# Patient Record
Sex: Male | Born: 1975 | Race: White | Hispanic: No | Smoking: Never smoker
Health system: Southern US, Community
[De-identification: ages and names within clinical notes are randomized; demographics above are authoritative.]

---

## 2015-10-10 ENCOUNTER — Emergency Department (HOSPITAL_COMMUNITY)
Admission: EM | Admit: 2015-10-10 | Discharge: 2015-10-10 | Disposition: A | Discharge status: Home / Self Care | Attending: Emergency Medicine | Admitting: Emergency Medicine

## 2015-10-10 ENCOUNTER — Emergency Department (HOSPITAL_COMMUNITY)

## 2015-10-10 ENCOUNTER — Encounter (HOSPITAL_COMMUNITY): Payer: Self-pay | Admitting: *Deleted

## 2015-10-10 DIAGNOSIS — Z87442 Personal history of urinary calculi: Secondary | ICD-10-CM | POA: Diagnosis not present

## 2015-10-10 DIAGNOSIS — M545 Low back pain, unspecified: Secondary | ICD-10-CM

## 2015-10-10 DIAGNOSIS — R1013 Epigastric pain: Secondary | ICD-10-CM | POA: Diagnosis not present

## 2015-10-10 DIAGNOSIS — R109 Unspecified abdominal pain: Secondary | ICD-10-CM | POA: Diagnosis present

## 2015-10-10 LAB — CBC WITH DIFFERENTIAL/PLATELET
BASOS PCT: 1 %
Basophils Absolute: 0.1 10*3/uL (ref 0.0–0.1)
EOS ABS: 0.4 10*3/uL (ref 0.0–0.7)
EOS PCT: 4 %
HCT: 47.3 % (ref 39.0–52.0)
Hemoglobin: 16.2 g/dL (ref 13.0–17.0)
LYMPHS ABS: 3.7 10*3/uL (ref 0.7–4.0)
Lymphocytes Relative: 33 %
MCH: 30.7 pg (ref 26.0–34.0)
MCHC: 34.2 g/dL (ref 30.0–36.0)
MCV: 89.8 fL (ref 78.0–100.0)
Monocytes Absolute: 1.6 10*3/uL — ABNORMAL HIGH (ref 0.1–1.0)
Monocytes Relative: 14 %
NEUTROS PCT: 49 %
Neutro Abs: 5.5 10*3/uL (ref 1.7–7.7)
PLATELETS: 261 10*3/uL (ref 150–400)
RBC: 5.27 MIL/uL (ref 4.22–5.81)
RDW: 13.3 % (ref 11.5–15.5)
WBC: 11.2 10*3/uL — AB (ref 4.0–10.5)

## 2015-10-10 LAB — URINE MICROSCOPIC-ADD ON: RBC / HPF: NONE SEEN RBC/hpf (ref 0–5)

## 2015-10-10 LAB — COMPREHENSIVE METABOLIC PANEL
ALT: 43 U/L (ref 17–63)
AST: 35 U/L (ref 15–41)
Albumin: 4.5 g/dL (ref 3.5–5.0)
Alkaline Phosphatase: 56 U/L (ref 38–126)
Anion gap: 9 (ref 5–15)
BUN: 12 mg/dL (ref 6–20)
CHLORIDE: 105 mmol/L (ref 101–111)
CO2: 31 mmol/L (ref 22–32)
Calcium: 9.7 mg/dL (ref 8.9–10.3)
Creatinine, Ser: 0.96 mg/dL (ref 0.61–1.24)
Glucose, Bld: 97 mg/dL (ref 65–99)
POTASSIUM: 4.7 mmol/L (ref 3.5–5.1)
SODIUM: 145 mmol/L (ref 135–145)
Total Bilirubin: 0.6 mg/dL (ref 0.3–1.2)
Total Protein: 7.5 g/dL (ref 6.5–8.1)

## 2015-10-10 LAB — URINALYSIS, ROUTINE W REFLEX MICROSCOPIC
Glucose, UA: NEGATIVE mg/dL
HGB URINE DIPSTICK: NEGATIVE
KETONES UR: NEGATIVE mg/dL
Leukocytes, UA: NEGATIVE
NITRITE: NEGATIVE
PROTEIN: 30 mg/dL — AB
pH: 5.5 (ref 5.0–8.0)

## 2015-10-10 LAB — LIPASE, BLOOD: LIPASE: 30 U/L (ref 11–51)

## 2015-10-10 MED ORDER — FAMOTIDINE 20 MG PO TABS: 20.0000 mg | ORAL_TABLET | Freq: Two times a day (BID) | ORAL | Status: AC

## 2015-10-10 MED ORDER — FAMOTIDINE 20 MG PO TABS
20.0000 mg | ORAL_TABLET | Freq: Once | ORAL | Status: AC
  Administered 2015-10-10: 20 mg via ORAL
  Filled 2015-10-10: qty 1

## 2015-10-10 MED ORDER — IOHEXOL 300 MG/ML  SOLN
100.0000 mL | Freq: Once | INTRAMUSCULAR | Status: AC | PRN
  Administered 2015-10-10: 100 mL via INTRAVENOUS

## 2015-10-10 MED ORDER — ONDANSETRON HCL 4 MG/2ML IJ SOLN
4.0000 mg | Freq: Once | INTRAMUSCULAR | Status: AC
  Administered 2015-10-10: 4 mg via INTRAVENOUS
  Filled 2015-10-10: qty 2

## 2015-10-10 MED ORDER — MORPHINE SULFATE (PF) 4 MG/ML IV SOLN
4.0000 mg | Freq: Once | INTRAVENOUS | Status: AC
  Administered 2015-10-10: 4 mg via INTRAVENOUS
  Filled 2015-10-10: qty 1

## 2015-10-10 NOTE — ED Notes (Signed)
Pt reporting he saw a physician today and was informed he had kidney stones.  Reports painful urination and pain across lower back.  Pt unsure what side kidney stone is on.

## 2015-10-10 NOTE — Discharge Instructions (Signed)

## 2015-10-10 NOTE — ED Provider Notes (Signed)
CSN: [id number]     Arrival date & time 10/10/15  1857 History  By signing my name below, I, Keith Mckinney, attest that this documentation has been prepared under the direction and in the presence of Keith Hong, MD. Electronically Signed: Soijett Mckinney, ED Scribe. 10/10/2015. 9:14 PM.   Chief Complaint  Patient presents with  . Flank Pain      The history is provided by the patient. No language interpreter was used.    HPI Comments: Keith Mckinney is a 39 y.o. male who presents to the Emergency Department via [facility] complaining of constant, worsening, flank pain onset 3-4 days. He notes that he was seen on 10/10/2015 while at prison and was inforemed that he had kidney stons but he is unsure of what side the stone is on.and was informed by the physician today while at prison that he had kidney stones but he is unsure of what side the kidney stone is on. He notes that he will be released from prison this weekend and he has been incarcerated x 8 months. He states that he is having associated symptoms of dysuria, low back pain, abdominal pain, and vomiting. He states that he has not tried any medications for the relief for his symptoms. He denies penile drainage/pain, urinary infections, and any other symptoms. Denies abdomdinal surgery. Denies any PMHx of medical issues.    History reviewed. No pertinent past medical history. History reviewed. No pertinent past surgical history. History reviewed. No pertinent family history. Social History  Substance Use Topics  . Smoking status: Never Smoker   . Smokeless tobacco: None  . Alcohol Use: No    Review of Systems  All other systems reviewed and are negative.     Allergies  Ibuprofen  Home Medications   Prior to Admission medications   Medication Sig Start Date End Date Taking? Authorizing Provider  famotidine (PEPCID) 20 MG tablet Take 1 tablet (20 mg total) by mouth 2 (two) times daily. 10/10/15   Keith Hong, MD   BP 132/90 mmHg   Pulse 58  Temp(Src) 97.3 F (36.3 C) (Oral)  Resp 18  Ht  (1.702 m)  Wt 175 lb (79.379 kg)  BMI 27.40 kg/m2  SpO2 99% Physical Exam  Constitutional: He is oriented to person, place, and time. He appears well-developed and well-nourished. No distress.  HENT:  Head: Normocephalic and atraumatic.  Right Ear: Hearing normal.  Left Ear: Hearing normal.  Nose: Nose normal.  Mouth/Throat: Oropharynx is clear and moist and mucous membranes are normal.  Eyes: Conjunctivae and EOM are normal. Pupils are equal, round, and reactive to light.  Neck: Normal range of motion. Neck supple.  Cardiovascular: Regular rhythm, S1 normal and S2 normal.  Exam reveals no gallop and no friction rub.   No murmur heard. Pulmonary/Chest: Effort normal and breath sounds normal. No respiratory distress. He exhibits no tenderness.  Abdominal: Soft. Normal appearance and bowel sounds are normal. There is no hepatosplenomegaly. There is tenderness in the epigastric area. There is no rebound, no guarding, no CVA tenderness, no tenderness at McBurney's point and negative Murphy's sign. No hernia.  Epigastric tenderness. Right inguinal tenderness and lower quadrant tenderness. No CVA tenderness.   Genitourinary: Testes normal and penis normal.  Nl penis, testes, and scrotum.  Musculoskeletal: Normal range of motion.  Neurological: He is alert and oriented to person, place, and time. He has normal strength. No cranial nerve deficit or sensory deficit. Coordination normal. GCS eye subscore is 4. GCS  verbal subscore is 5. GCS motor subscore is 6.  Skin: Skin is warm, dry and intact. No rash noted. No cyanosis.  Psychiatric: He has a normal mood and affect. His speech is normal and behavior is normal. Thought content normal.  Nursing note and vitals reviewed.   ED Course  Procedures (including critical care time) DIAGNOSTIC STUDIES: Oxygen Saturation is 97% on RA, nl by my interpretation.    COORDINATION OF  CARE: 9:13 PM Discussed treatment plan with pt at bedside which includes UA, labs, morphine injection, zofran, and CT abdomen pelvis with contrast and pt agreed to plan.    Labs Review Labs Reviewed  URINALYSIS, ROUTINE W REFLEX MICROSCOPIC (NOT AT [facility]) - Abnormal; Notable for the following:    Specific Gravity, Urine >1.030 (*)    Bilirubin Urine SMALL (*)    Protein, ur 30 (*)    All other components within normal limits  CBC WITH DIFFERENTIAL/PLATELET - Abnormal; Notable for the following:    WBC 11.2 (*)    Monocytes Absolute 1.6 (*)    All other components within normal limits  URINE MICROSCOPIC-ADD ON - Abnormal; Notable for the following:    Squamous Epithelial / LPF 0-5 (*)    Bacteria, UA RARE (*)    All other components within normal limits  COMPREHENSIVE METABOLIC PANEL  LIPASE, BLOOD    Imaging Review Ct Abdomen Pelvis W Contrast  10/10/2015  CLINICAL DATA:  Constant worsening low back pain and flank pain for 4 days. Associated dysuria, abdominal pain, and vomiting. EXAM: CT ABDOMEN AND PELVIS WITH CONTRAST TECHNIQUE: Multidetector CT imaging of the abdomen and pelvis was performed using the standard protocol following bolus administration of intravenous contrast. CONTRAST:  100mL OMNIPAQUE IOHEXOL 300 MG/ML  SOLN COMPARISON:  None. FINDINGS: Dependent atelectasis in the lung bases. Diffuse fatty infiltration of the liver. No focal liver lesions. The gallbladder, pancreas, adrenal glands, kidneys, abdominal aorta, inferior vena cava, and retroperitoneal lymph nodes are unremarkable. Stomach, small bowel, and colon are not abnormally distended. No discrete wall thickening is appreciated. Scattered stool in the colon. No free air or free fluid in the abdomen. Abdominal wall musculature appears intact. Pelvis: Scattered diverticula in the sigmoid colon without evidence of diverticulitis. Bladder wall is not thickened. No bladder stones. Prostate gland is not enlarged. No free or  loculated pelvic fluid collections. No pelvic mass or lymphadenopathy. The appendix is normal. No destructive bone lesions. IMPRESSION: No acute process demonstrated in the abdomen or pelvis. No evidence of bowel obstruction or inflammation. Diffuse fatty infiltration of the liver. Diverticula in the colon without evidence of diverticulitis. Electronically Signed   By: Burman NievesWilliam  Stevens M.D.   On: 10/10/2015 22:52   I have personally reviewed and evaluated these images and lab results as part of my medical decision-making.    MDM   Final diagnoses:  Epigastric pain  Bilateral low back pain without sciatica    The patient is well-appearing, he has a negative CT scan of the abdomen and pelvis showing no signs of acute pathology. His urinalysis does not show infection nor does it show hematuriaThe patient has normal heart sounds, no murmurs rubs or gallops, no tachycardia, strong pulses at the radial arteries bilaterally, no JVD, normal capillary refill time less than 2 seconds. The patient has no signs of aneurysm, no fever, no tachycardia and is otherwise stable for discharge. The etiology of the patient's symptoms is not clear, he can be started on anti-acid medications in hopes that he  has some relief of his epigastric pain, there is no signs of perforation, no signs of kidney stone, no other acute pathology that would require admission to the hospital for further testing. The patient can further pursue his symptoms as an outpatient.  Meds given in ED:  Medications  famotidine (PEPCID) tablet 20 mg (not administered)  morphine 4 MG/ML injection 4 mg (4 mg Intravenous Given 10/10/15 2132)  ondansetron (ZOFRAN) injection 4 mg (4 mg Intravenous Given 10/10/15 2132)  iohexol (OMNIPAQUE) 300 MG/ML solution 100 mL (100 mLs Intravenous Contrast Given 10/10/15 2233)    New Prescriptions   FAMOTIDINE (PEPCID) 20 MG TABLET    Take 1 tablet (20 mg total) by mouth 2 (two) times daily.      I  personally performed the services described in this documentation, which was scribed in my presence. The recorded information has been reviewed and is accurate.       Keith Hong, MD 10/10/15 [phone]

## 2017-08-07 IMAGING — CT CT ABD-PELV W/ CM
2 of 4 series · 16 of 46 positions shown, 18 images · IV contrast (omnipaque)
Comparison: None.

CLINICAL DATA: Constant worsening low back pain and flank pain for
4 days. Associated dysuria, abdominal pain, and vomiting.

EXAM:
CT ABDOMEN AND PELVIS WITH CONTRAST
TECHNIQUE: Multidetector CT imaging of the abdomen and pelvis was performed
using the standard protocol following bolus administration of
intravenous contrast.
CONTRAST:  100mL OMNIPAQUE IOHEXOL 300 MG/ML  SOLN

[Series 2: abd_pel_with 5.0 b40f · axial · 0.75mm/px · z∈[-560,-70]mm · 13 of 108 slices shown, 15 images]
[im 5/108  soft-tissue]
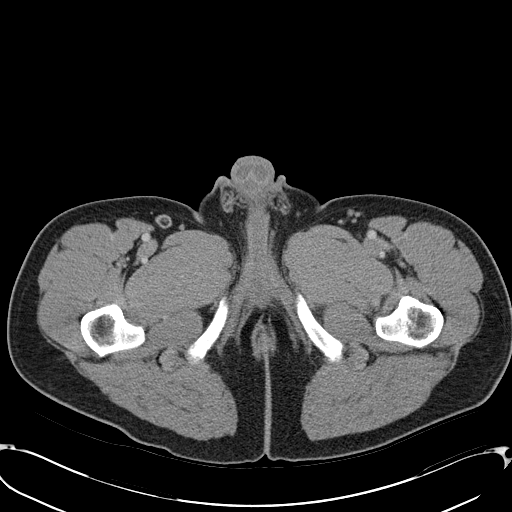
[im 5/108  bone]
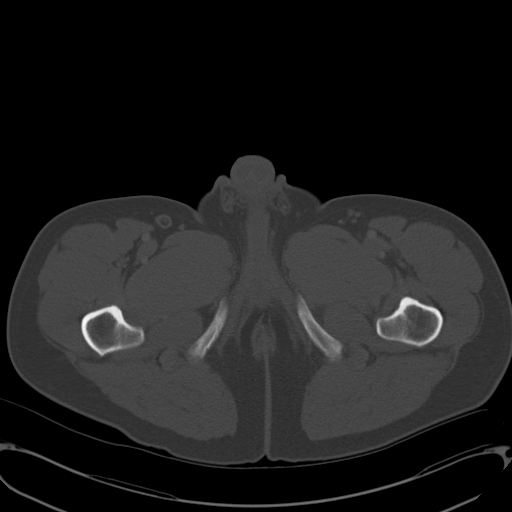
[im 14/108  soft-tissue]
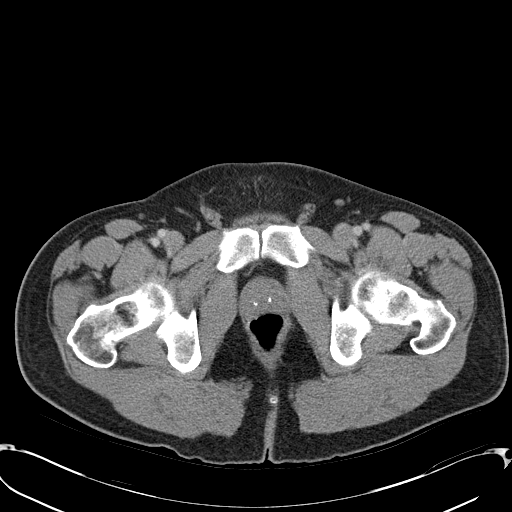
[im 24/108  soft-tissue]
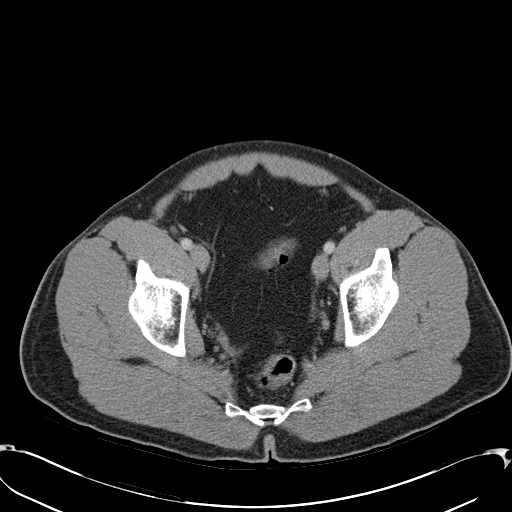
[im 28/108  soft-tissue]
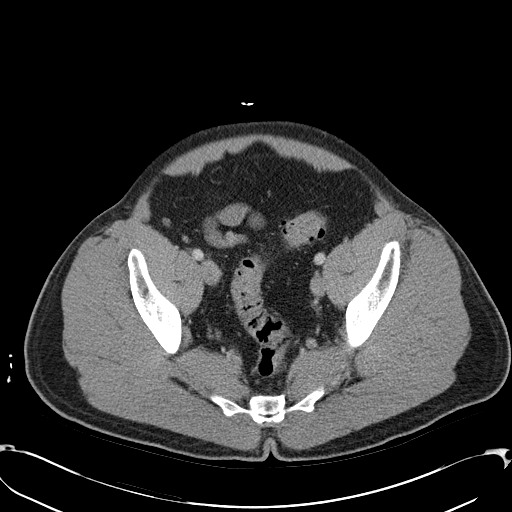
[im 38/108  soft-tissue]
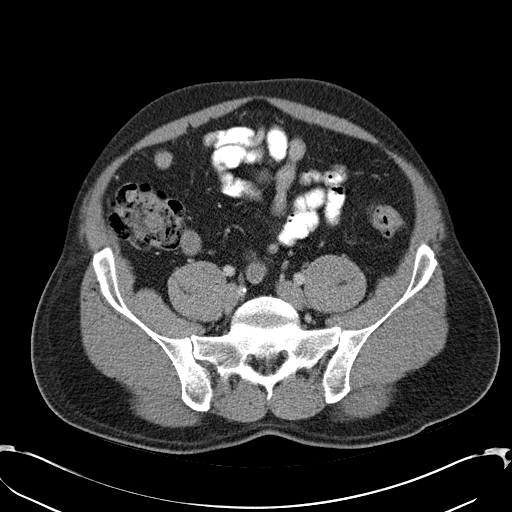
[im 47/108  soft-tissue]
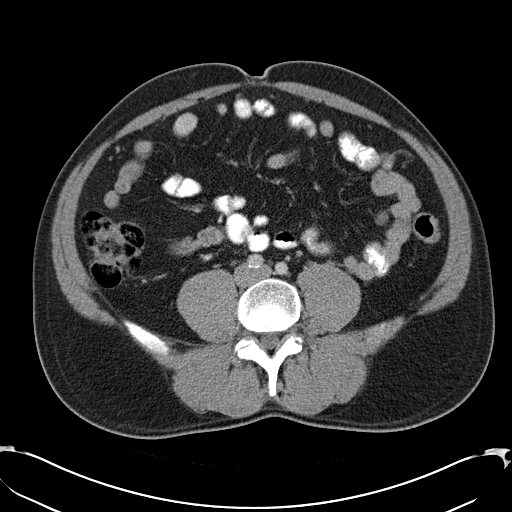
[im 56/108  soft-tissue]
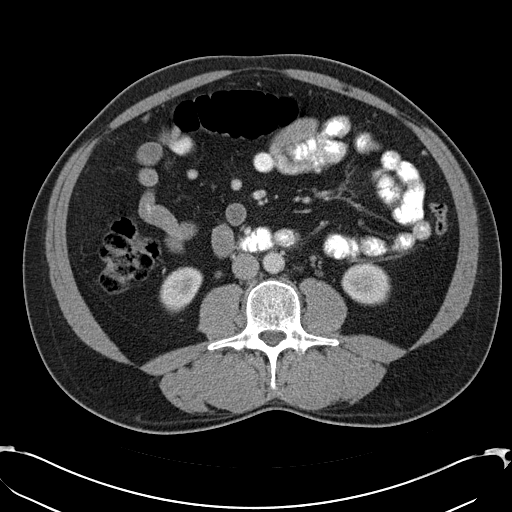
[im 61/108  soft-tissue]
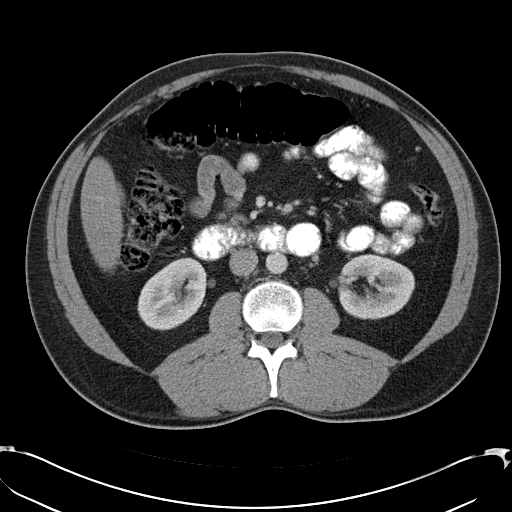
[im 70/108  soft-tissue]
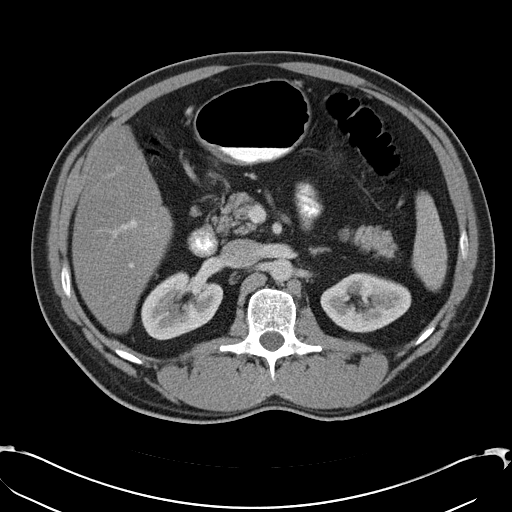
[im 70/108  bone]
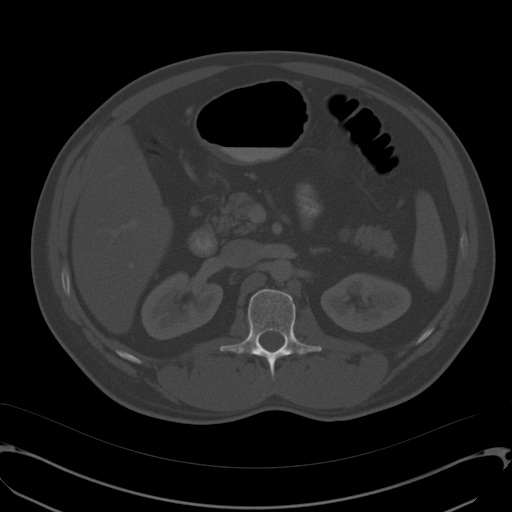
[im 80/108  soft-tissue]
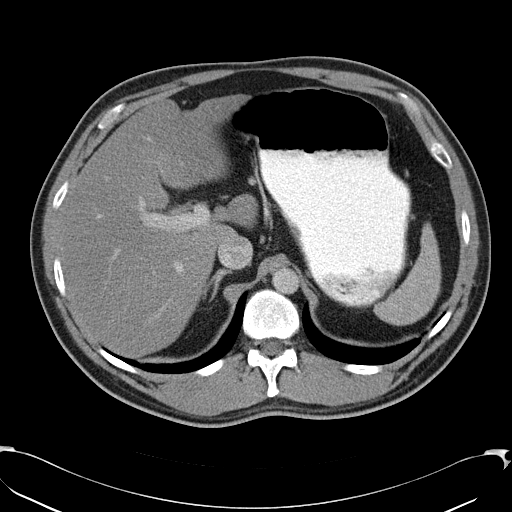
[im 84/108  soft-tissue]
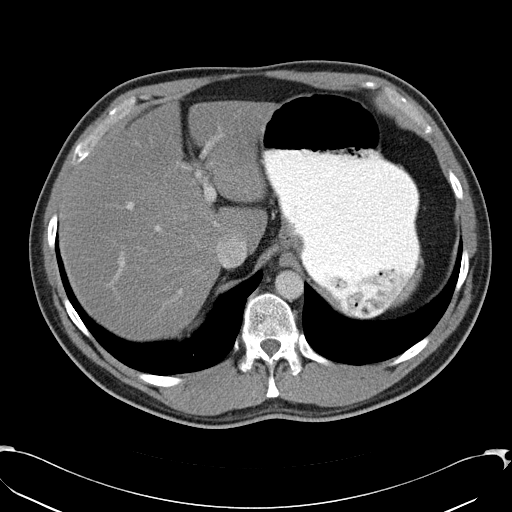
[im 94/108  soft-tissue]
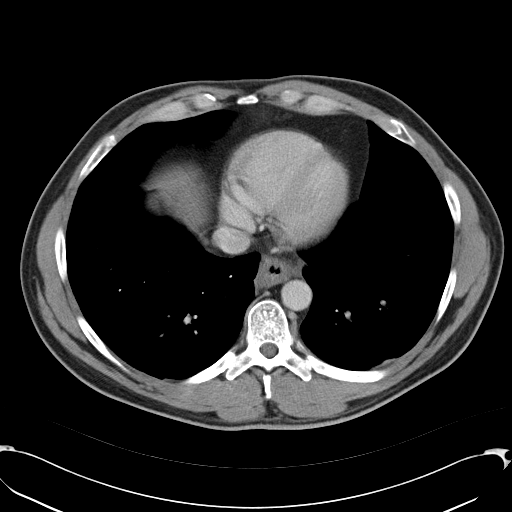
[im 103/108  soft-tissue]
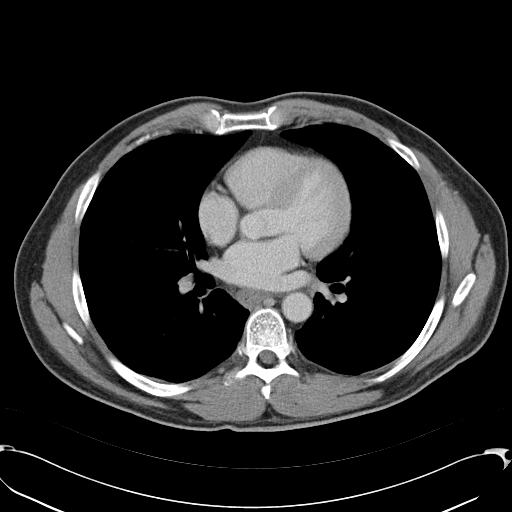

[Series 3: abd_pel_with 3.0 spo cor · coronal · 0.71mm/px · 3 of 95 slices shown]
[im 32/95  soft-tissue]
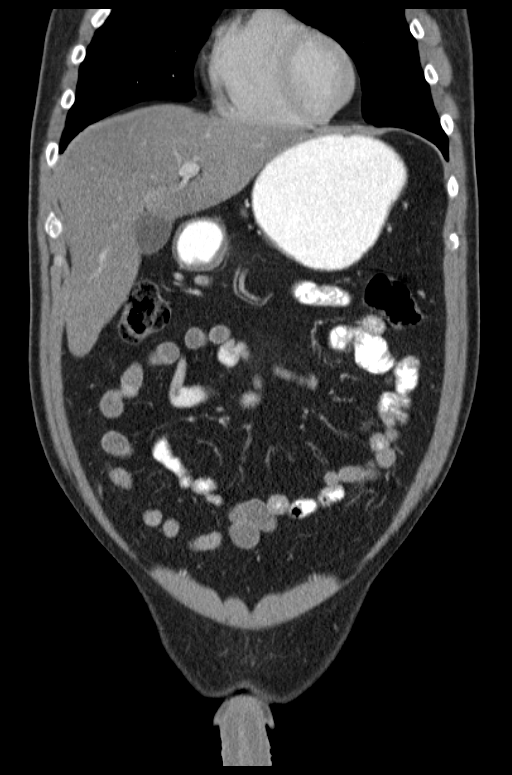
[im 42/95  soft-tissue]
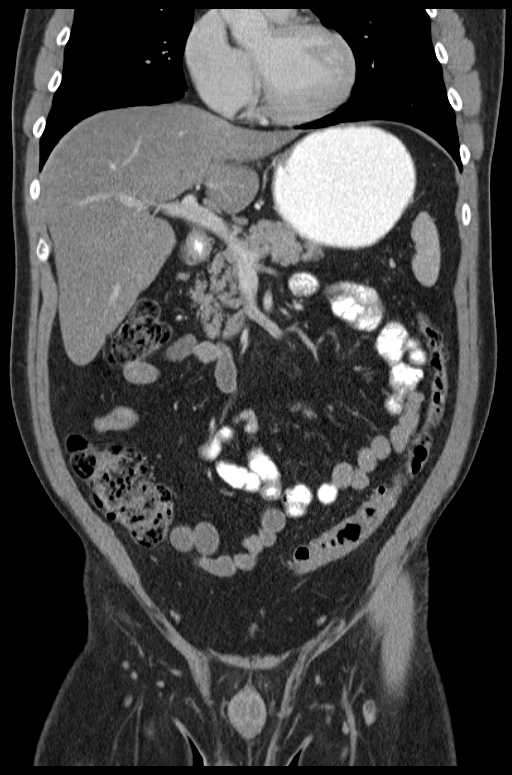
[im 53/95  soft-tissue]
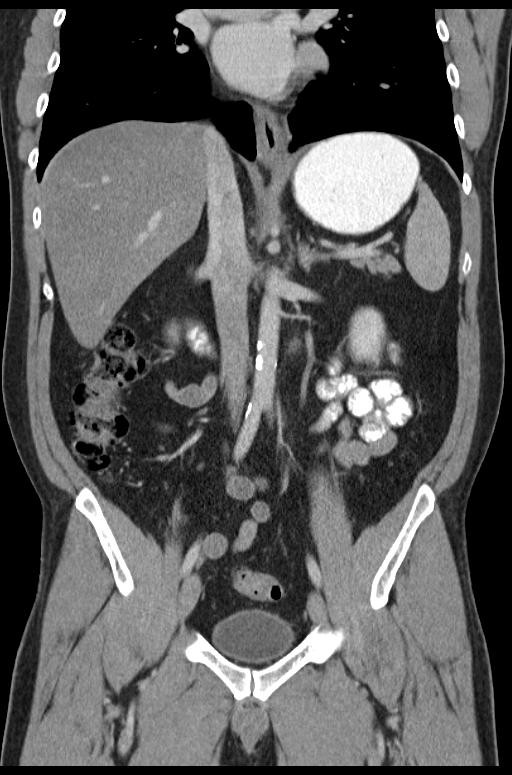

[16 of 46 positions shown; findings below may reference images not displayed]

FINDINGS: Dependent atelectasis in the lung bases.

Diffuse fatty infiltration of the liver. No focal liver lesions. The
gallbladder, pancreas, adrenal glands, kidneys, abdominal aorta,
inferior vena cava, and retroperitoneal lymph nodes are
unremarkable. Stomach, small bowel, and colon are not abnormally
distended. No discrete wall thickening is appreciated. Scattered
stool in the colon. No free air or free fluid in the abdomen.
Abdominal wall musculature appears intact.

Pelvis: Scattered diverticula in the sigmoid colon without evidence
of diverticulitis. Bladder wall is not thickened. No bladder stones.
Prostate gland is not enlarged. No free or loculated pelvic fluid
collections. No pelvic mass or lymphadenopathy. The appendix is
normal. No destructive bone lesions.
IMPRESSION: No acute process demonstrated in the abdomen or pelvis. No evidence
of bowel obstruction or inflammation. Diffuse fatty infiltration of
the liver. Diverticula in the colon without evidence of
diverticulitis.
